# Patient Record
Sex: Female | Born: 1965
Health system: Southern US, Community
[De-identification: ages and names within clinical notes are randomized; demographics above are authoritative.]

## PROBLEM LIST (undated history)

## (undated) DIAGNOSIS — I10 Essential (primary) hypertension: Secondary | ICD-10-CM

## (undated) DIAGNOSIS — E119 Type 2 diabetes mellitus without complications: Secondary | ICD-10-CM

---

## 2008-06-24 ENCOUNTER — Ambulatory Visit: Payer: Self-pay | Admitting: Family Medicine

## 2016-09-07 DIAGNOSIS — Z124 Encounter for screening for malignant neoplasm of cervix: Secondary | ICD-10-CM | POA: Diagnosis not present

## 2016-09-07 DIAGNOSIS — Z01419 Encounter for gynecological examination (general) (routine) without abnormal findings: Secondary | ICD-10-CM | POA: Diagnosis not present

## 2016-10-02 DIAGNOSIS — E119 Type 2 diabetes mellitus without complications: Secondary | ICD-10-CM | POA: Diagnosis not present

## 2016-10-02 DIAGNOSIS — E089 Diabetes mellitus due to underlying condition without complications: Secondary | ICD-10-CM | POA: Diagnosis not present

## 2016-10-16 DIAGNOSIS — Z79899 Other long term (current) drug therapy: Secondary | ICD-10-CM | POA: Diagnosis not present

## 2016-10-16 DIAGNOSIS — I10 Essential (primary) hypertension: Secondary | ICD-10-CM | POA: Diagnosis not present

## 2016-10-16 DIAGNOSIS — E119 Type 2 diabetes mellitus without complications: Secondary | ICD-10-CM | POA: Diagnosis not present

## 2017-01-24 DIAGNOSIS — J029 Acute pharyngitis, unspecified: Secondary | ICD-10-CM | POA: Diagnosis not present

## 2017-04-19 ENCOUNTER — Other Ambulatory Visit: Payer: Self-pay | Admitting: Nurse Practitioner

## 2017-04-19 DIAGNOSIS — E559 Vitamin D deficiency, unspecified: Secondary | ICD-10-CM | POA: Diagnosis not present

## 2017-04-19 DIAGNOSIS — Z0000 Encounter for general adult medical examination without abnormal findings: Secondary | ICD-10-CM | POA: Diagnosis not present

## 2017-04-19 DIAGNOSIS — E119 Type 2 diabetes mellitus without complications: Secondary | ICD-10-CM | POA: Diagnosis not present

## 2017-04-19 DIAGNOSIS — I10 Essential (primary) hypertension: Secondary | ICD-10-CM | POA: Diagnosis not present

## 2017-04-19 DIAGNOSIS — Z1239 Encounter for other screening for malignant neoplasm of breast: Secondary | ICD-10-CM

## 2017-04-19 DIAGNOSIS — Z124 Encounter for screening for malignant neoplasm of cervix: Secondary | ICD-10-CM | POA: Diagnosis not present

## 2017-07-16 DIAGNOSIS — Z1211 Encounter for screening for malignant neoplasm of colon: Secondary | ICD-10-CM | POA: Diagnosis not present

## 2017-07-30 DIAGNOSIS — K648 Other hemorrhoids: Secondary | ICD-10-CM | POA: Diagnosis not present

## 2017-07-30 DIAGNOSIS — K635 Polyp of colon: Secondary | ICD-10-CM | POA: Diagnosis not present

## 2017-07-30 DIAGNOSIS — D126 Benign neoplasm of colon, unspecified: Secondary | ICD-10-CM | POA: Diagnosis not present

## 2017-07-30 DIAGNOSIS — Z1211 Encounter for screening for malignant neoplasm of colon: Secondary | ICD-10-CM | POA: Diagnosis not present

## 2017-07-30 DIAGNOSIS — D120 Benign neoplasm of cecum: Secondary | ICD-10-CM | POA: Diagnosis not present

## 2017-10-17 DIAGNOSIS — E119 Type 2 diabetes mellitus without complications: Secondary | ICD-10-CM | POA: Diagnosis not present

## 2017-10-17 DIAGNOSIS — Z79899 Other long term (current) drug therapy: Secondary | ICD-10-CM | POA: Diagnosis not present

## 2017-10-17 DIAGNOSIS — I10 Essential (primary) hypertension: Secondary | ICD-10-CM | POA: Diagnosis not present

## 2017-10-23 ENCOUNTER — Ambulatory Visit
Admission: RE | Admit: 2017-10-23 | Discharge: 2017-10-23 | Disposition: A | Discharge status: Home / Self Care | Payer: 59 | Source: Ambulatory Visit | Attending: Nurse Practitioner | Admitting: Nurse Practitioner

## 2017-10-23 ENCOUNTER — Encounter: Payer: Self-pay | Admitting: Radiology

## 2017-10-23 DIAGNOSIS — R921 Mammographic calcification found on diagnostic imaging of breast: Secondary | ICD-10-CM | POA: Diagnosis not present

## 2017-10-23 DIAGNOSIS — Z1231 Encounter for screening mammogram for malignant neoplasm of breast: Secondary | ICD-10-CM | POA: Diagnosis not present

## 2017-10-23 DIAGNOSIS — Z1239 Encounter for other screening for malignant neoplasm of breast: Secondary | ICD-10-CM

## 2017-10-30 ENCOUNTER — Other Ambulatory Visit: Payer: Self-pay | Admitting: *Deleted

## 2017-10-30 ENCOUNTER — Inpatient Hospital Stay
Admission: RE | Admit: 2017-10-30 | Discharge: 2017-10-30 | Disposition: A | Discharge status: Home / Self Care | Payer: Self-pay | Source: Ambulatory Visit | Attending: *Deleted | Admitting: *Deleted

## 2017-10-30 DIAGNOSIS — Z9289 Personal history of other medical treatment: Secondary | ICD-10-CM

## 2017-11-06 ENCOUNTER — Other Ambulatory Visit: Payer: Self-pay | Admitting: Nurse Practitioner

## 2017-11-06 DIAGNOSIS — R921 Mammographic calcification found on diagnostic imaging of breast: Secondary | ICD-10-CM

## 2017-11-06 DIAGNOSIS — R928 Other abnormal and inconclusive findings on diagnostic imaging of breast: Secondary | ICD-10-CM

## 2017-11-06 DIAGNOSIS — H524 Presbyopia: Secondary | ICD-10-CM | POA: Diagnosis not present

## 2017-11-06 DIAGNOSIS — E119 Type 2 diabetes mellitus without complications: Secondary | ICD-10-CM | POA: Diagnosis not present

## 2017-11-08 ENCOUNTER — Ambulatory Visit
Admission: RE | Admit: 2017-11-08 | Discharge: 2017-11-08 | Disposition: A | Discharge status: Home / Self Care | Payer: 59 | Source: Ambulatory Visit | Attending: Nurse Practitioner | Admitting: Nurse Practitioner

## 2017-11-08 DIAGNOSIS — R928 Other abnormal and inconclusive findings on diagnostic imaging of breast: Secondary | ICD-10-CM

## 2017-11-08 DIAGNOSIS — R921 Mammographic calcification found on diagnostic imaging of breast: Secondary | ICD-10-CM | POA: Insufficient documentation

## 2017-11-14 ENCOUNTER — Other Ambulatory Visit: Payer: Self-pay | Admitting: Nurse Practitioner

## 2017-11-14 DIAGNOSIS — R928 Other abnormal and inconclusive findings on diagnostic imaging of breast: Secondary | ICD-10-CM

## 2017-11-14 DIAGNOSIS — R921 Mammographic calcification found on diagnostic imaging of breast: Secondary | ICD-10-CM

## 2017-11-20 ENCOUNTER — Ambulatory Visit
Admission: RE | Admit: 2017-11-20 | Discharge: 2017-11-20 | Disposition: A | Discharge status: Home / Self Care | Payer: 59 | Source: Ambulatory Visit | Attending: Nurse Practitioner | Admitting: Nurse Practitioner

## 2017-11-20 DIAGNOSIS — N62 Hypertrophy of breast: Secondary | ICD-10-CM | POA: Insufficient documentation

## 2017-11-20 DIAGNOSIS — R928 Other abnormal and inconclusive findings on diagnostic imaging of breast: Secondary | ICD-10-CM | POA: Diagnosis present

## 2017-11-20 DIAGNOSIS — N6011 Diffuse cystic mastopathy of right breast: Secondary | ICD-10-CM | POA: Diagnosis not present

## 2017-11-20 DIAGNOSIS — R921 Mammographic calcification found on diagnostic imaging of breast: Secondary | ICD-10-CM | POA: Insufficient documentation

## 2017-11-20 HISTORY — PX: BREAST BIOPSY: SHX20

## 2017-11-21 LAB — SURGICAL PATHOLOGY

## 2018-05-10 DIAGNOSIS — E119 Type 2 diabetes mellitus without complications: Secondary | ICD-10-CM | POA: Diagnosis not present

## 2018-05-10 DIAGNOSIS — I10 Essential (primary) hypertension: Secondary | ICD-10-CM | POA: Diagnosis not present

## 2018-05-10 DIAGNOSIS — Z23 Encounter for immunization: Secondary | ICD-10-CM | POA: Diagnosis not present

## 2018-05-10 DIAGNOSIS — Z0000 Encounter for general adult medical examination without abnormal findings: Secondary | ICD-10-CM | POA: Diagnosis not present

## 2018-10-16 DIAGNOSIS — E089 Diabetes mellitus due to underlying condition without complications: Secondary | ICD-10-CM | POA: Diagnosis not present

## 2018-10-16 DIAGNOSIS — E119 Type 2 diabetes mellitus without complications: Secondary | ICD-10-CM | POA: Diagnosis not present

## 2018-10-17 DIAGNOSIS — Z30431 Encounter for routine checking of intrauterine contraceptive device: Secondary | ICD-10-CM | POA: Diagnosis not present

## 2019-04-09 ENCOUNTER — Other Ambulatory Visit: Payer: Self-pay | Admitting: Nurse Practitioner

## 2019-04-09 DIAGNOSIS — Z1231 Encounter for screening mammogram for malignant neoplasm of breast: Secondary | ICD-10-CM

## 2019-05-01 IMAGING — MG DIGITAL SCREENING BILATERAL MAMMOGRAM WITH CAD
8 series · 8 of 8 positions shown · non-contrast
Comparison: 04/27/2014 from [facility].

CLINICAL DATA: Screening.

EXAM:
DIGITAL SCREENING BILATERAL MAMMOGRAM WITH CAD

[R CC (1 of 2)]
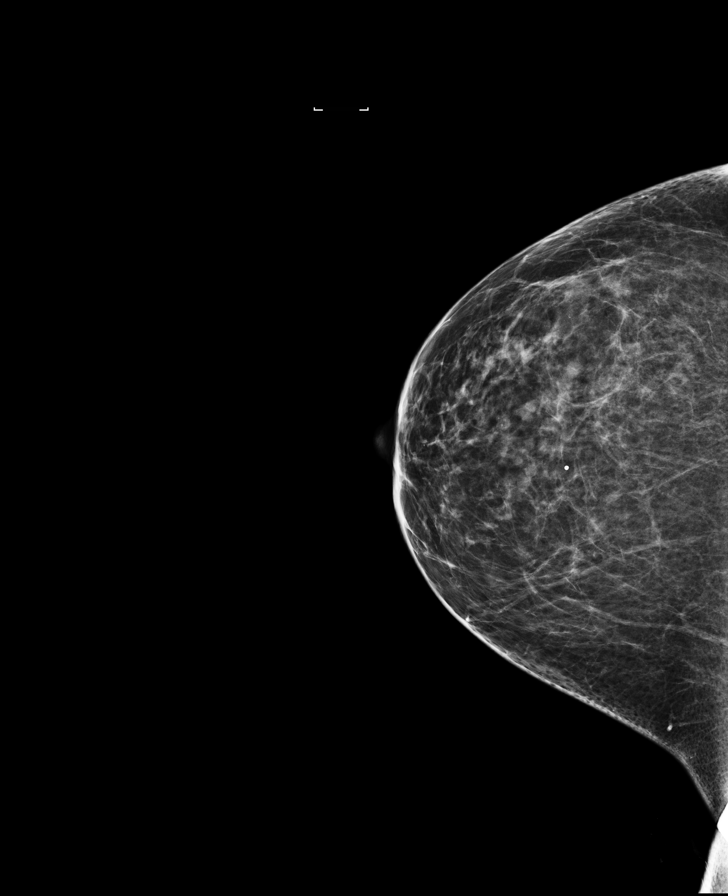

[R MLO (1 of 4)]
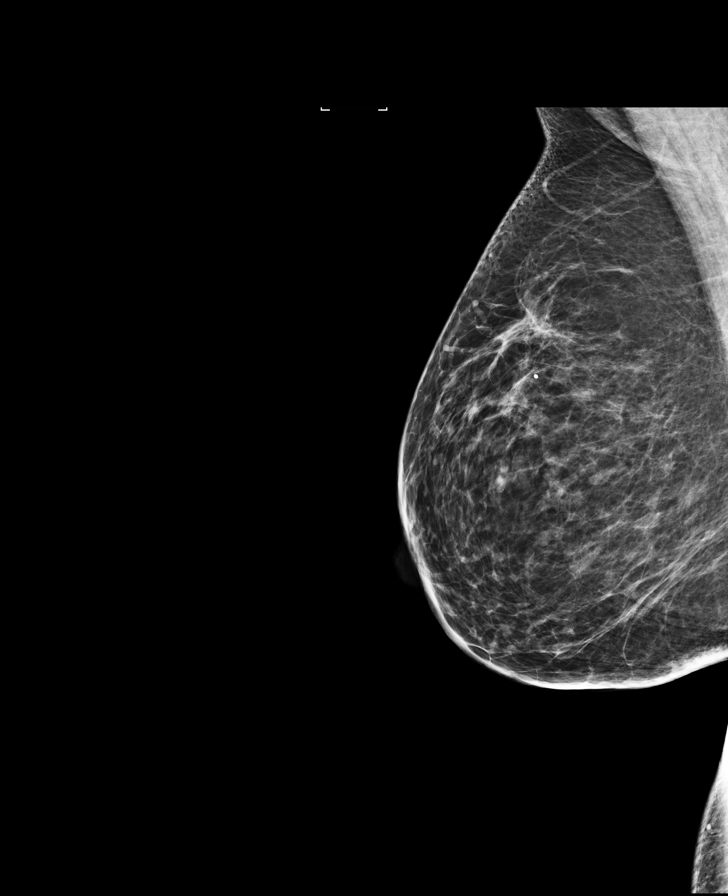

[R MLO (2 of 4)]
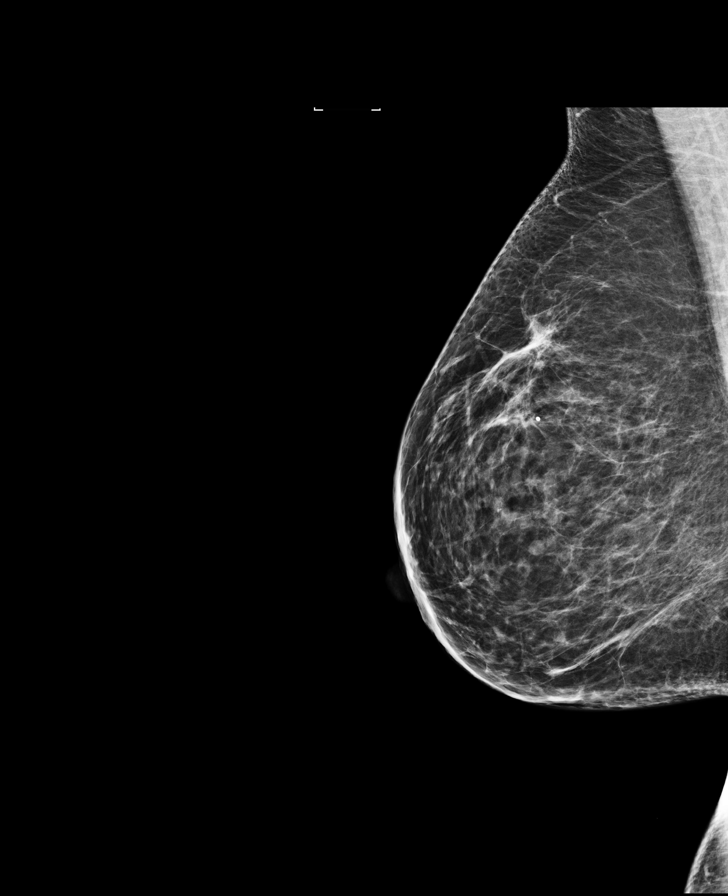

[L CC]
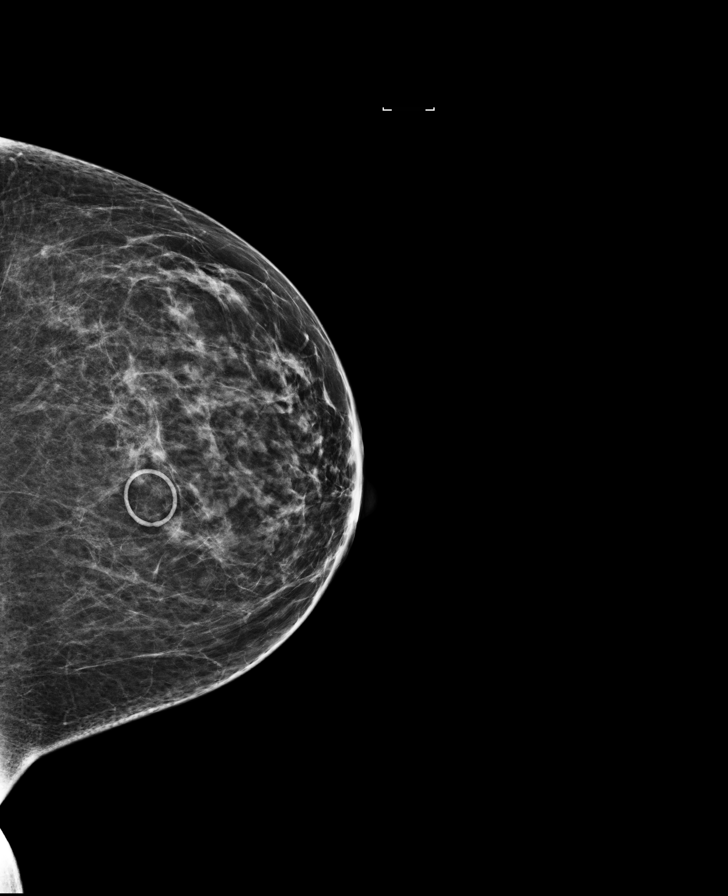

[L MLO]
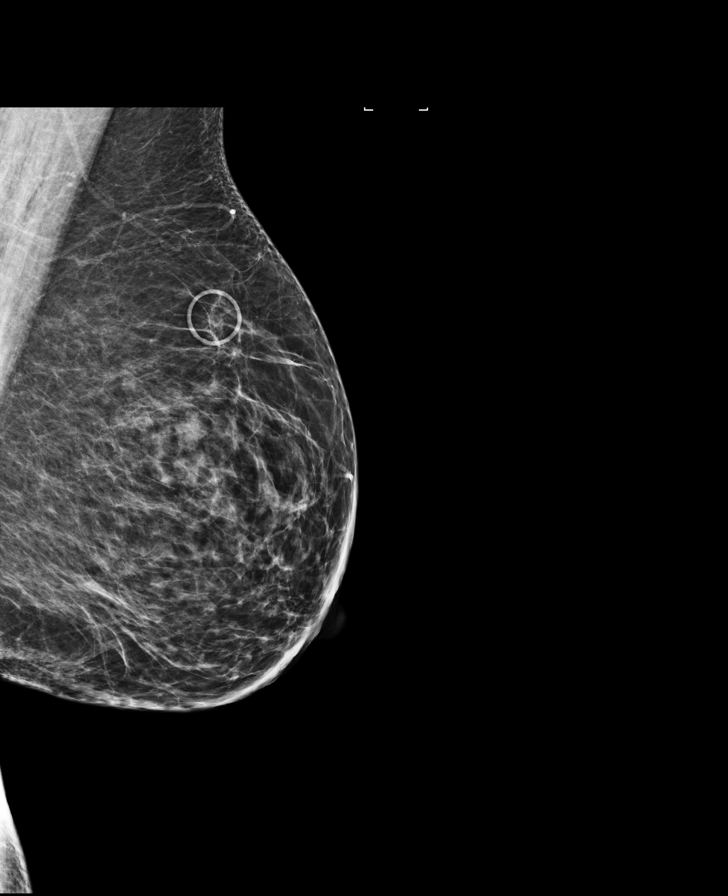

[R MLO (3 of 4)]
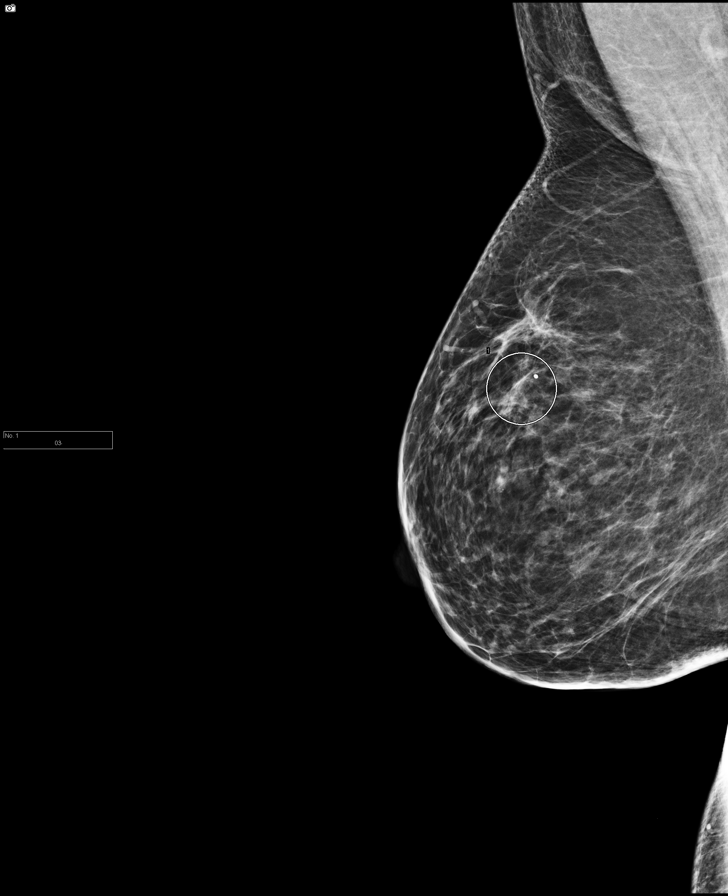

[R MLO (4 of 4)]
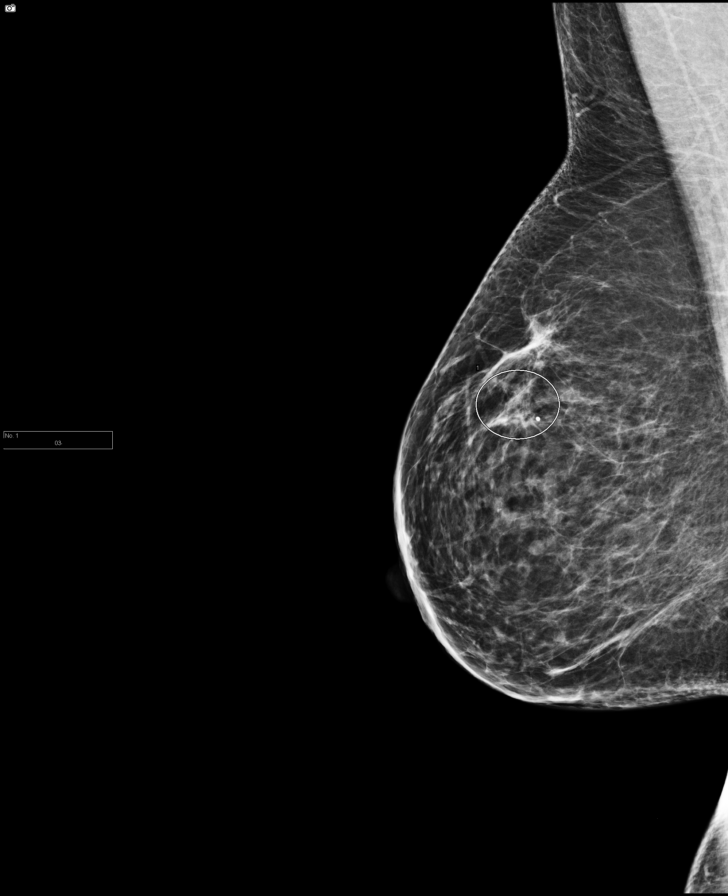

[R CC (2 of 2)]
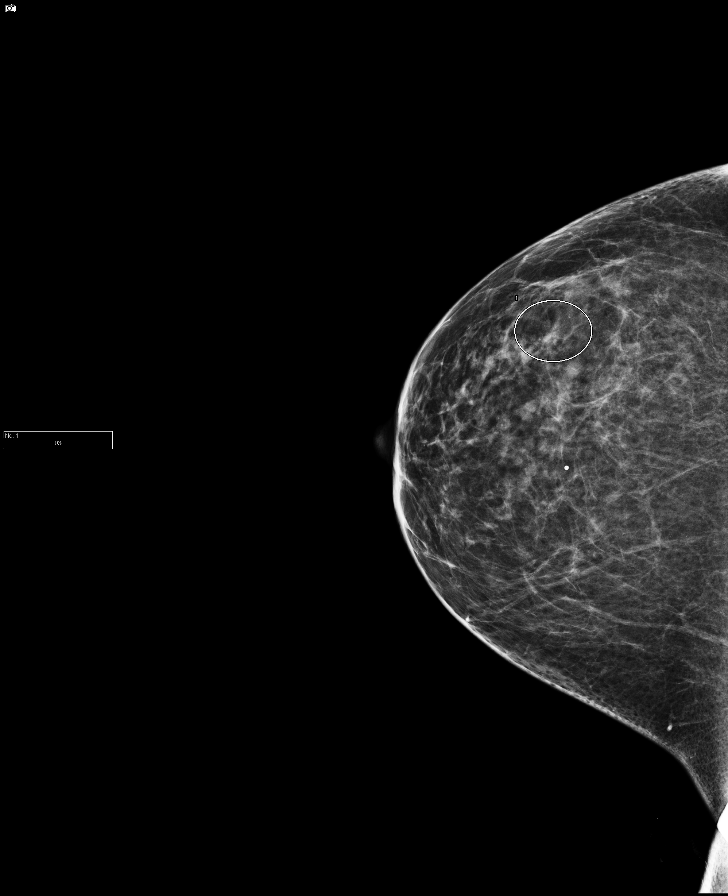

[8 of 8 positions shown; findings below may reference images not displayed]

Awaiting the prior
outside mammograms accounts for the delay in this report.

ACR Breast Density Category b: There are scattered areas of
fibroglandular density.
FINDINGS: In the right breast, calcifications warrant further evaluation with
magnified views. In the left breast, no findings suspicious for
malignancy. Images were processed with CAD.
IMPRESSION: Further evaluation is suggested for calcifications in the right
breast.

RECOMMENDATION:
Diagnostic mammogram of the right breast. (Code:[id number])

The patient will be contacted regarding the findings, and additional
imaging will be scheduled.

BI-RADS CATEGORY  0: Incomplete. Need additional imaging evaluation
and/or prior mammograms for comparison.

## 2019-10-13 ENCOUNTER — Other Ambulatory Visit: Payer: Self-pay | Admitting: Nurse Practitioner

## 2019-10-13 DIAGNOSIS — Z1231 Encounter for screening mammogram for malignant neoplasm of breast: Secondary | ICD-10-CM

## 2019-11-13 ENCOUNTER — Ambulatory Visit
Admission: RE | Admit: 2019-11-13 | Discharge: 2019-11-13 | Disposition: A | Discharge status: Home / Self Care | Payer: 59 | Source: Ambulatory Visit | Attending: Nurse Practitioner | Admitting: Nurse Practitioner

## 2019-11-13 DIAGNOSIS — Z1231 Encounter for screening mammogram for malignant neoplasm of breast: Secondary | ICD-10-CM | POA: Insufficient documentation

## 2020-10-21 ENCOUNTER — Other Ambulatory Visit: Payer: Self-pay | Admitting: Nurse Practitioner

## 2020-10-21 DIAGNOSIS — Z1231 Encounter for screening mammogram for malignant neoplasm of breast: Secondary | ICD-10-CM

## 2021-06-20 ENCOUNTER — Ambulatory Visit
Admission: EM | Admit: 2021-06-20 | Discharge: 2021-06-20 | Disposition: A | Discharge status: Home / Self Care | Payer: 59 | Attending: Physician Assistant | Admitting: Physician Assistant

## 2021-06-20 ENCOUNTER — Encounter: Payer: Self-pay | Admitting: Emergency Medicine

## 2021-06-20 ENCOUNTER — Other Ambulatory Visit: Payer: Self-pay

## 2021-06-20 DIAGNOSIS — R300 Dysuria: Secondary | ICD-10-CM | POA: Insufficient documentation

## 2021-06-20 DIAGNOSIS — N3000 Acute cystitis without hematuria: Secondary | ICD-10-CM | POA: Diagnosis present

## 2021-06-20 HISTORY — DX: Type 2 diabetes mellitus without complications: E11.9

## 2021-06-20 HISTORY — DX: Essential (primary) hypertension: I10

## 2021-06-20 LAB — URINALYSIS, COMPLETE (UACMP) WITH MICROSCOPIC
Bilirubin Urine: NEGATIVE
Glucose, UA: 100 mg/dL — AB
Hgb urine dipstick: NEGATIVE
Ketones, ur: NEGATIVE mg/dL
Leukocytes,Ua: NEGATIVE
Nitrite: POSITIVE — AB
Protein, ur: NEGATIVE mg/dL
Specific Gravity, Urine: <1.005 — ABNORMAL LOW (ref 1.005–1.030)
pH: 5 (ref 5.0–8.0)

## 2021-06-20 MED ORDER — NITROFURANTOIN MONOHYD MACRO 100 MG PO CAPS: 100.0000 mg | ORAL_CAPSULE | Freq: Two times a day (BID) | ORAL | 0 refills | Status: AC

## 2021-06-20 NOTE — Discharge Instructions (Signed)

## 2021-06-20 NOTE — ED Provider Notes (Signed)
[facility]    CSN: [id number] Arrival date & time: 06/20/21  1430      History   Chief Complaint Chief Complaint  Patient presents with   Dysuria    HPI Emily Vincent is a 55 y.o. female presenting for onset of dysuria, urinary frequency and urgency yesterday.  Patient denies any fever, chills, fatigue, flank pain and abdominal pain.  No hematuria reported.  History of UTIs and believes her symptoms are consistent with UTI.  Has taken AZO for symptoms and says it has helped.  Medical history significant for hypertension and diabetes.  No other complaints.  HPI  Past Medical History:  Diagnosis Date   Diabetes mellitus without complication ([place])    Hypertension     There are no problems to display for this patient.   Past Surgical History:  Procedure Laterality Date   BREAST BIOPSY Right 11/20/2017   affirm bx, x marker PASH, Columnar Cell Change, no atypia    OB History   No obstetric history on file.      Home Medications    Prior to Admission medications   Medication Sig Start Date End Date Taking? Authorizing Provider  atorvastatin (LIPITOR) 10 MG tablet Take 10 mg by mouth daily. 05/30/21  Yes [provider]  Cholecalciferol (VITAMIN D3) 1.25 MG (50000 UT) CAPS Take by mouth.   Yes [provider]  citalopram (CELEXA) 40 MG tablet Take 40 mg by mouth daily. 05/30/21  Yes [provider]  ferrous sulfate 325 (65 FE) MG EC tablet Take 325 mg by mouth 3 (three) times daily with meals.   Yes [provider]  hydrochlorothiazide (HYDRODIURIL) 25 MG tablet Take 25 mg by mouth daily. 05/30/21  Yes [provider]  lansoprazole (PREVACID) 15 MG capsule Take by mouth. 11/12/20  Yes [provider]  magnesium oxide (MAG-OX) 400 MG tablet Take 1 tablet by mouth 2 (two) times daily. 02/07/21  Yes [provider]  metFORMIN (GLUCOPHAGE-XR) 500 MG 24 hr tablet Take 1,000 mg by mouth 2 (two) times  daily. 05/30/21  Yes [provider]  nitrofurantoin, macrocrystal-monohydrate, (MACROBID) 100 MG capsule Take 1 capsule (100 mg total) by mouth 2 (two) times daily for 5 days. 06/20/21 06/25/21 Yes Laurene Footman B, PA-C  pioglitazone (ACTOS) 15 MG tablet Take 15 mg by mouth daily. 05/30/21  Yes [provider]    Family History Family History  Problem Relation Age of Onset   Breast cancer Mother 30   Breast cancer Maternal Aunt        mat aunt and great aunt    Social History Social History   Tobacco Use   Smoking status: Never   Smokeless tobacco: Never  Vaping Use   Vaping Use: Never used  Substance Use Topics   Alcohol use: Not Currently   Drug use: Not Currently     Allergies   Ace inhibitors and Tetracycline   Review of Systems Review of Systems  Constitutional:  Negative for chills and fever.  Gastrointestinal:  Negative for abdominal pain, diarrhea, nausea and vomiting.  Genitourinary:  Positive for dysuria, frequency and urgency. Negative for decreased urine volume, flank pain, hematuria, pelvic pain, vaginal bleeding, vaginal discharge and vaginal pain.  Musculoskeletal:  Negative for back pain.  Skin:  Negative for rash.    Physical Exam Triage Vital Signs ED Triage Vitals  Enc Vitals Group     BP 06/20/21 1639 (!) 159/80     Pulse Rate  06/20/21 1639 66     Resp 06/20/21 1639 18     Temp 06/20/21 1639 98 F (36.7 C)     Temp Source 06/20/21 1639 Oral     SpO2 06/20/21 1639 99 %     Weight 06/20/21 1635 200 lb (90.7 kg)     Height 06/20/21 1635 5\' 5"  (1.651 m)     Head Circumference --      Peak Flow --      Pain Score 06/20/21 1634 0     Pain Loc --      Pain Edu? --      Excl. in [place]? --    No data found.  Updated Vital Signs BP (!) 159/80 (BP Location: Left Arm)   Pulse 66   Temp 98 F (36.7 C) (Oral)   Resp 18   Ht 5\' 5"  (1.651 m)   Wt 200 lb (90.7 kg)   SpO2 99%   BMI 33.28 kg/m      Physical Exam Vitals and  nursing note reviewed.  Constitutional:      General: She is not in acute distress.    Appearance: Normal appearance. She is not ill-appearing or toxic-appearing.  HENT:     Head: Normocephalic and atraumatic.  Eyes:     General: No scleral icterus.       Right eye: No discharge.        Left eye: No discharge.     Conjunctiva/sclera: Conjunctivae normal.  Cardiovascular:     Rate and Rhythm: Normal rate and regular rhythm.     Heart sounds: Normal heart sounds.  Pulmonary:     Effort: Pulmonary effort is normal. No respiratory distress.     Breath sounds: Normal breath sounds.  Abdominal:     Palpations: Abdomen is soft.     Tenderness: There is no abdominal tenderness. There is no right CVA tenderness or left CVA tenderness.  Musculoskeletal:     Cervical back: Neck supple.  Skin:    General: Skin is dry.  Neurological:     General: No focal deficit present.     Mental Status: She is alert. Mental status is at baseline.     Motor: No weakness.     Gait: Gait normal.  Psychiatric:        Mood and Affect: Mood normal.        Behavior: Behavior normal.        Thought Content: Thought content normal.     UC Treatments / Results  Labs (all labs ordered are listed, but only abnormal results are displayed) Labs Reviewed  URINALYSIS, COMPLETE (UACMP) WITH MICROSCOPIC - Abnormal; Notable for the following components:      Result Value   Color, Urine ORANGE (*)    Specific Gravity, Urine <1.005 (*)    Glucose, UA 100 (*)    Nitrite POSITIVE (*)    Bacteria, UA MANY (*)    All other components within normal limits  URINE CULTURE    EKG   Radiology No results found.  Procedures Procedures (including critical care time)  Medications Ordered in UC Medications - No data to display  Initial Impression / Assessment and Plan / UC Course  I have reviewed the triage vital signs and the nursing notes.  Pertinent labs & imaging results that were available during my care  of the patient were reviewed by me and considered in my medical decision making (see chart for details).  55 year old female presenting for 2-day  history of dysuria, urinary frequency and urgency.  Urinalysis obtained today shows orange coloration of urine (patient has been taking AZO), glucose, positive nitrites and many bacteria.  We will send urine for culture and treat for UTI based on symptoms and urinalysis.  I have sent in [place].  Advised to continue with the AZO.  Increase rest and fluids.  Reviewed return and ED precautions.   Final Clinical Impressions(s) / UC Diagnoses   Final diagnoses:  Acute cystitis without hematuria  Dysuria     Discharge Instructions      UTI: Based on either symptoms or urinalysis, you may have a urinary tract infection. We will send the urine for culture and call with results in a few days. Begin antibiotics at this time. Your symptoms should be much improved over the next 2-3 days. Increase rest and fluid intake. If for some reason symptoms are worsening or not improving after a couple of days or the urine culture determines the antibiotics you are taking will not treat the infection, the antibiotics may be changed. Return or go to ER for fever, back pain, worsening urinary pain, discharge, increased blood in urine. May take Tylenol or Motrin OTC for pain relief or consider AZO if no contraindications      ED Prescriptions     Medication Sig Dispense Auth. Provider   nitrofurantoin, macrocrystal-monohydrate, (MACROBID) 100 MG capsule Take 1 capsule (100 mg total) by mouth 2 (two) times daily for 5 days. 10 capsule Danton Clap, PA-C      PDMP not reviewed this encounter.   Danton Clap, PA-C 06/20/21 1733

## 2021-06-20 NOTE — ED Triage Notes (Signed)
Pt c/o dysuria, urinary frequency, bladder fullness. Started yesterday. She has been taking Azo.

## 2021-06-22 LAB — URINE CULTURE: Culture: >=100000 — AB

## 2021-08-29 ENCOUNTER — Ambulatory Visit
Admission: EM | Admit: 2021-08-29 | Discharge: 2021-08-29 | Disposition: A | Discharge status: Home / Self Care | Payer: 59 | Attending: Physician Assistant | Admitting: Physician Assistant

## 2021-08-29 ENCOUNTER — Other Ambulatory Visit: Payer: Self-pay

## 2021-08-29 DIAGNOSIS — R300 Dysuria: Secondary | ICD-10-CM | POA: Insufficient documentation

## 2021-08-29 DIAGNOSIS — N3001 Acute cystitis with hematuria: Secondary | ICD-10-CM | POA: Diagnosis present

## 2021-08-29 LAB — URINALYSIS, COMPLETE (UACMP) WITH MICROSCOPIC
Glucose, UA: 100 mg/dL — AB
Hgb urine dipstick: NEGATIVE
Leukocytes,Ua: NEGATIVE
Nitrite: POSITIVE — AB
Specific Gravity, Urine: 1.02 (ref 1.005–1.030)
pH: 5 (ref 5.0–8.0)

## 2021-08-29 MED ORDER — PHENAZOPYRIDINE HCL 200 MG PO TABS: 200.0000 mg | ORAL_TABLET | Freq: Three times a day (TID) | ORAL | 0 refills | Status: AC

## 2021-08-29 MED ORDER — CEPHALEXIN 500 MG PO CAPS: 500.0000 mg | ORAL_CAPSULE | Freq: Two times a day (BID) | ORAL | 0 refills | Status: AC

## 2021-08-29 NOTE — ED Provider Notes (Signed)
[facility]    CSN: [id number] Arrival date & time: 08/29/21  8250      History   Chief Complaint Chief Complaint  Patient presents with   Hematuria   Urinary pain    HPI Emily Vincent is a 56 y.o. female presenting for onset of dysuria, hematuria, frequency and urgency about 3 days ago.  Patient has been taking AZO which has helped symptoms somewhat.  Has not taken any AZO since 4 PM yesterday.  Denies any associated fever, flank pain or abdominal pain.  Reports of some cramping in the bladder region.  No other complaints.  HPI  Past Medical History:  Diagnosis Date   Diabetes mellitus without complication ([place])    Hypertension     There are no problems to display for this patient.   Past Surgical History:  Procedure Laterality Date   BREAST BIOPSY Right 11/20/2017   affirm bx, x marker PASH, Columnar Cell Change, no atypia    OB History   No obstetric history on file.      Home Medications    Prior to Admission medications   Medication Sig Start Date End Date Taking? Authorizing Provider  atorvastatin (LIPITOR) 10 MG tablet Take 10 mg by mouth daily. 05/30/21  Yes [provider]  cephALEXin (KEFLEX) 500 MG capsule Take 1 capsule (500 mg total) by mouth 2 (two) times daily for 7 days. 08/29/21 09/05/21 Yes Danton Clap, PA-C  Cholecalciferol (VITAMIN D3) 1.25 MG (50000 UT) CAPS Take by mouth.   Yes [provider]  citalopram (CELEXA) 40 MG tablet Take 40 mg by mouth daily. 05/30/21  Yes [provider]  ferrous sulfate 325 (65 FE) MG EC tablet Take 325 mg by mouth 3 (three) times daily with meals.   Yes [provider]  hydrochlorothiazide (HYDRODIURIL) 25 MG tablet Take 25 mg by mouth daily. 05/30/21  Yes [provider]  lansoprazole (PREVACID) 15 MG capsule Take by mouth. 11/12/20  Yes [provider]  magnesium oxide (MAG-OX) 400 MG tablet Take 1 tablet by mouth 2 (two) times daily. 02/07/21   Yes [provider]  metFORMIN (GLUCOPHAGE-XR) 500 MG 24 hr tablet Take 1,000 mg by mouth 2 (two) times daily. 05/30/21  Yes [provider]  phenazopyridine (PYRIDIUM) 200 MG tablet Take 1 tablet (200 mg total) by mouth 3 (three) times daily. 08/29/21  Yes Laurene Footman B, PA-C  pioglitazone (ACTOS) 15 MG tablet Take 15 mg by mouth daily. 05/30/21  Yes [provider]    Family History Family History  Problem Relation Age of Onset   Breast cancer Mother 10   Breast cancer Maternal Aunt        mat aunt and great aunt    Social History Social History   Tobacco Use   Smoking status: Never   Smokeless tobacco: Never  Vaping Use   Vaping Use: Never used  Substance Use Topics   Alcohol use: Yes   Drug use: Not Currently     Allergies   Ace inhibitors and Tetracycline   Review of Systems Review of Systems  Constitutional:  Negative for chills, fatigue and fever.  Gastrointestinal:  Negative for abdominal pain, diarrhea, nausea and vomiting.  Genitourinary:  Positive for dysuria, frequency and urgency. Negative for decreased urine volume, flank pain, hematuria, pelvic pain, vaginal bleeding, vaginal discharge and vaginal pain.  Musculoskeletal:  Negative for back pain.  Skin:  Negative for rash.    Physical Exam Triage  Vital Signs ED Triage Vitals  Enc Vitals Group     BP      Pulse      Resp      Temp      Temp src      SpO2      Weight      Height      Head Circumference      Peak Flow      Pain Score      Pain Loc      Pain Edu?      Excl. in [place]?    No data found.  Updated Vital Signs BP (!) 147/71 (BP Location: Left Arm)    Pulse 78    Temp 98.6 F (37 C) (Oral)    Resp 18    Ht 5\' 5"  (1.651 m)    Wt 207 lb (93.9 kg)    SpO2 97%    BMI 34.45 kg/m      Physical Exam Vitals and nursing note reviewed.  Constitutional:      General: She is not in acute distress.    Appearance: Normal appearance. She is not ill-appearing or  toxic-appearing.  HENT:     Head: Normocephalic and atraumatic.  Eyes:     General: No scleral icterus.       Right eye: No discharge.        Left eye: No discharge.     Conjunctiva/sclera: Conjunctivae normal.  Cardiovascular:     Rate and Rhythm: Normal rate and regular rhythm.     Heart sounds: Normal heart sounds.  Pulmonary:     Effort: Pulmonary effort is normal. No respiratory distress.     Breath sounds: Normal breath sounds.  Abdominal:     General: Distension: suprapubic.     Palpations: Abdomen is soft.     Tenderness: There is abdominal tenderness. There is no right CVA tenderness, left CVA tenderness, guarding or rebound.  Musculoskeletal:     Cervical back: Neck supple.  Skin:    General: Skin is dry.  Neurological:     General: No focal deficit present.     Mental Status: She is alert. Mental status is at baseline.     Motor: No weakness.     Gait: Gait normal.  Psychiatric:        Mood and Affect: Mood normal.        Behavior: Behavior normal.        Thought Content: Thought content normal.     UC Treatments / Results  Labs (all labs ordered are listed, but only abnormal results are displayed) Labs Reviewed  URINALYSIS, COMPLETE (UACMP) WITH MICROSCOPIC - Abnormal; Notable for the following components:      Result Value   Color, Urine AMBER (*)    Glucose, UA 100 (*)    Bilirubin Urine SMALL (*)    Ketones, ur TRACE (*)    Protein, ur TRACE (*)    Nitrite POSITIVE (*)    Bacteria, UA RARE (*)    All other components within normal limits  URINE CULTURE    EKG   Radiology No results found.  Procedures Procedures (including critical care time)  Medications Ordered in UC Medications - No data to display  Initial Impression / Assessment and Plan / UC Course  I have reviewed the triage vital signs and the nursing notes.  Pertinent labs & imaging results that were available during my care of the patient were reviewed by me and  considered in  my medical decision making (see chart for details).  56 year old female presenting for dysuria, urinary frequency/urgency and hematuria x3 days.  Patient is afebrile and overall well-appearing.  On exam she has mild tenderness palpation of the suprapubic region.  No CVA tenderness, guarding or rebound.  Urinalysis obtained today reveals small bili, trace ketones, trace protein, positive nitrites and glucose. Urine culture sent.  Patient's presentation and labs consistent with UTI.  Treating with Keflex.  Advised to increase rest and fluids.  Reviewed that we will contact her and change antibiotic if needed.  Follow-up as needed.   Final Clinical Impressions(s) / UC Diagnoses   Final diagnoses:  Acute cystitis with hematuria  Dysuria     Discharge Instructions      UTI: Based on either symptoms or urinalysis, you may have a urinary tract infection. We will send the urine for culture and call with results in a few days. Begin antibiotics at this time. Your symptoms should be much improved over the next 2-3 days. Increase rest and fluid intake. If for some reason symptoms are worsening or not improving after a couple of days or the urine culture determines the antibiotics you are taking will not treat the infection, the antibiotics may be changed. Return or go to ER for fever, back pain, worsening urinary pain, discharge, increased blood in urine. May take Tylenol or Motrin OTC for pain relief or consider AZO if no contraindications      ED Prescriptions     Medication Sig Dispense Auth. Provider   cephALEXin (KEFLEX) 500 MG capsule Take 1 capsule (500 mg total) by mouth 2 (two) times daily for 7 days. 14 capsule Laurene Footman B, PA-C   phenazopyridine (PYRIDIUM) 200 MG tablet Take 1 tablet (200 mg total) by mouth 3 (three) times daily. 6 tablet Gretta Cool      PDMP not reviewed this encounter.   Danton Clap, PA-C 08/29/21 1106

## 2021-08-29 NOTE — Discharge Instructions (Addendum)

## 2021-08-29 NOTE — ED Triage Notes (Signed)
Pt c/o urinary burning, bloody urine. Pt is taking Azo. Symptoms started 3 days ago.

## 2021-08-30 LAB — URINE CULTURE: Culture: NO GROWTH

## 2021-11-04 ENCOUNTER — Other Ambulatory Visit: Payer: Self-pay | Admitting: Nurse Practitioner

## 2021-11-04 DIAGNOSIS — Z1231 Encounter for screening mammogram for malignant neoplasm of breast: Secondary | ICD-10-CM

## 2021-12-19 ENCOUNTER — Ambulatory Visit
Admission: RE | Admit: 2021-12-19 | Discharge: 2021-12-19 | Disposition: A | Discharge status: Home / Self Care | Payer: 59 | Source: Ambulatory Visit | Attending: Nurse Practitioner | Admitting: Nurse Practitioner

## 2021-12-19 DIAGNOSIS — Z1231 Encounter for screening mammogram for malignant neoplasm of breast: Secondary | ICD-10-CM | POA: Insufficient documentation

## 2022-11-28 ENCOUNTER — Other Ambulatory Visit: Payer: Self-pay | Admitting: Nurse Practitioner

## 2022-11-28 DIAGNOSIS — Z1231 Encounter for screening mammogram for malignant neoplasm of breast: Secondary | ICD-10-CM

## 2022-12-21 ENCOUNTER — Ambulatory Visit
Admission: RE | Admit: 2022-12-21 | Discharge: 2022-12-21 | Disposition: A | Discharge status: Home / Self Care | Payer: 59 | Source: Ambulatory Visit | Attending: Nurse Practitioner | Admitting: Nurse Practitioner

## 2022-12-21 DIAGNOSIS — Z1231 Encounter for screening mammogram for malignant neoplasm of breast: Secondary | ICD-10-CM | POA: Insufficient documentation

## 2023-11-22 ENCOUNTER — Ambulatory Visit: Payer: 59 | Admitting: Dermatology

## 2023-12-07 ENCOUNTER — Other Ambulatory Visit: Payer: Self-pay | Admitting: Nurse Practitioner

## 2023-12-07 DIAGNOSIS — Z1231 Encounter for screening mammogram for malignant neoplasm of breast: Secondary | ICD-10-CM

## 2024-02-26 ENCOUNTER — Ambulatory Visit

## 2024-03-04 ENCOUNTER — Ambulatory Visit
Admission: RE | Admit: 2024-03-04 | Discharge: 2024-03-04 | Disposition: A | Discharge status: Home / Self Care | Source: Ambulatory Visit | Attending: Nurse Practitioner | Admitting: Nurse Practitioner

## 2024-03-04 DIAGNOSIS — Z1231 Encounter for screening mammogram for malignant neoplasm of breast: Secondary | ICD-10-CM | POA: Insufficient documentation

## 2024-03-11 ENCOUNTER — Encounter: Payer: Self-pay | Admitting: Nurse Practitioner

## 2024-03-13 ENCOUNTER — Other Ambulatory Visit: Payer: Self-pay | Admitting: Nurse Practitioner

## 2024-03-13 DIAGNOSIS — R928 Other abnormal and inconclusive findings on diagnostic imaging of breast: Secondary | ICD-10-CM

## 2024-03-14 ENCOUNTER — Other Ambulatory Visit

## 2024-03-14 ENCOUNTER — Encounter

## 2024-03-21 ENCOUNTER — Ambulatory Visit
Admission: RE | Admit: 2024-03-21 | Discharge: 2024-03-21 | Disposition: A | Discharge status: Home / Self Care | Source: Ambulatory Visit | Attending: Nurse Practitioner | Admitting: Nurse Practitioner

## 2024-03-21 ENCOUNTER — Other Ambulatory Visit: Payer: Self-pay | Admitting: Medical Genetics

## 2024-03-21 DIAGNOSIS — R928 Other abnormal and inconclusive findings on diagnostic imaging of breast: Secondary | ICD-10-CM | POA: Diagnosis present

## 2024-03-24 ENCOUNTER — Other Ambulatory Visit
Admission: RE | Admit: 2024-03-24 | Discharge: 2024-03-24 | Disposition: A | Discharge status: Home / Self Care | Payer: Self-pay | Source: Ambulatory Visit | Attending: Medical Genetics | Admitting: Medical Genetics

## 2024-04-18 LAB — GENECONNECT MOLECULAR SCREEN

## 2024-04-22 ENCOUNTER — Other Ambulatory Visit: Payer: Self-pay | Admitting: Medical Genetics

## 2024-04-22 ENCOUNTER — Telehealth: Payer: Self-pay | Admitting: Medical Genetics

## 2024-04-22 DIAGNOSIS — Z006 Encounter for examination for normal comparison and control in clinical research program: Secondary | ICD-10-CM

## 2024-04-22 NOTE — Telephone Encounter (Signed)
 [facility]  04/22/2024 11:40am  Confirmed I was speaking with Emily Vincent [phone] by using name and DOB. Informed participant the reason for this call is to follow-up on a recent sample the participant provided at one of the [facility] lab locations. Informed participant the test was not able to be completed with this sample and apologized for the inconvenience. Participant was requested to provide a new sample at one of our participating labs at no cost so that participant can continue participation and receive test results. Informed participant they do not need to be fasting and if there are other samples that need to be drawn, they can be done at the same visit. Participant has not had a blood transfusion or blood product in the last 30 days. Participant agreed to provide another sample. Participant was provided the [employer] program website to learn why this may have happened. Participant was thanked for their time and continued support of the above study.  Jordyn Pennstrom, BS [place]  [facility] Clinical Research Specialist II Direct Dial: [phone]  Fax: [phone]

## 2024-04-25 ENCOUNTER — Other Ambulatory Visit
Admission: RE | Admit: 2024-04-25 | Discharge: 2024-04-25 | Disposition: A | Discharge status: Home / Self Care | Payer: Self-pay | Source: Ambulatory Visit | Attending: Medical Genetics | Admitting: Medical Genetics

## 2024-04-25 DIAGNOSIS — Z006 Encounter for examination for normal comparison and control in clinical research program: Secondary | ICD-10-CM | POA: Insufficient documentation

## 2024-05-05 LAB — GENECONNECT MOLECULAR SCREEN: Genetic Analysis Overall Interpretation: NEGATIVE
